# Patient Record
Sex: Male | Born: 1990 | Race: White | Hispanic: No | Marital: Married | Smoking: Never smoker
Health system: Southern US, Community
[De-identification: ages and names within clinical notes are randomized; demographics above are authoritative.]

## PROBLEM LIST (undated history)

## (undated) HISTORY — PX: KNEE SURGERY: SHX244

---

## 2021-12-10 ENCOUNTER — Encounter (HOSPITAL_BASED_OUTPATIENT_CLINIC_OR_DEPARTMENT_OTHER): Payer: Self-pay

## 2021-12-10 ENCOUNTER — Emergency Department (HOSPITAL_BASED_OUTPATIENT_CLINIC_OR_DEPARTMENT_OTHER): Payer: Worker's Compensation

## 2021-12-10 ENCOUNTER — Emergency Department (HOSPITAL_BASED_OUTPATIENT_CLINIC_OR_DEPARTMENT_OTHER)
Admission: EM | Admit: 2021-12-10 | Discharge: 2021-12-10 | Disposition: A | Discharge status: Home / Self Care | Payer: Worker's Compensation | Attending: Emergency Medicine | Admitting: Emergency Medicine

## 2021-12-10 DIAGNOSIS — M25561 Pain in right knee: Secondary | ICD-10-CM | POA: Diagnosis present

## 2021-12-10 DIAGNOSIS — W228XXA Striking against or struck by other objects, initial encounter: Secondary | ICD-10-CM | POA: Diagnosis not present

## 2021-12-10 DIAGNOSIS — Y990 Civilian activity done for income or pay: Secondary | ICD-10-CM | POA: Insufficient documentation

## 2021-12-10 MED ORDER — OXYCODONE-ACETAMINOPHEN 5-325 MG PO TABS: 1.0000 | ORAL_TABLET | Freq: Four times a day (QID) | ORAL | 0 refills | Status: AC | PRN

## 2021-12-10 MED ORDER — KETOROLAC TROMETHAMINE 15 MG/ML IJ SOLN
15.0000 mg | Freq: Once | INTRAMUSCULAR | Status: AC
  Administered 2021-12-10: 15 mg via INTRAMUSCULAR
  Filled 2021-12-10: qty 1

## 2021-12-10 NOTE — Discharge Instructions (Signed)
Please use brace for comfort.  You may use crutches if that would also be easier.  I will write you a work note for Wednesday.  Please call the orthopedist and schedule a follow-up appointment.  As we discussed, please take 800 mg ibuprofen every 8 hours.  You can also take Percocet for breakthrough pain.  Return to the emergency department for any worsening symptoms. ?

## 2021-12-10 NOTE — ED Triage Notes (Signed)
States was hit in back of right knee at work and knee dislocated. Able to reduce on its own. States hx of same. Was informed he would need a knee replacement to fix issue. ?

## 2021-12-10 NOTE — ED Provider Notes (Signed)
?[facility] EMERGENCY DEPARTMENT ?Provider Note ? ? ?CSN: [id number] ?Arrival date & time: 12/10/21  1404 ? ?  ? ?History ?Chief Complaint  ?Patient presents with  ? Knee Pain  ? ? ?Justin Bush is a 31 y.o. male with history of right knee MPFL reconstruction with lateral femoral condyle bone marrow aspirate with intraosseous microfracture to the lateral femoral condyle, loose body removal, and injection of platelet rich plasma performed in 09/2019 who presents to the emergency department with right knee pain started just prior to arrival.  Patient was at work when a empty large water barrel bumped him in the back leg and he felt his patella go out of place and came back into place.  He states that he gets this clicking sensation sometimes before he had the surgery.  Since then he has been having pain which she rates a 7/10 in severity. ? ? ?Knee Pain ? ?  ? ?Home Medications ?Prior to Admission medications   ?Medication Sig Start Date End Date Taking? Authorizing Provider  ?oxyCODONE-acetaminophen (PERCOCET/ROXICET) 5-325 MG tablet Take 1-2 tablets by mouth every 6 (six) hours as needed for severe pain. 12/10/21  Yes Teressa Lower, PA-C  ?   ? ?Allergies    ?Patient has no known allergies.   ? ?Review of Systems   ?Review of Systems  ?All other systems reviewed and are negative. ? ?Physical Exam ?Updated Vital Signs ?BP 127/77   Pulse 72   Temp 98.3 ?F (36.8 ?C) (Oral)   Resp 16   Ht 5\' 6"  (1.676 m)   Wt 104.3 kg   SpO2 100%   BMI 37.12 kg/m?  ?Physical Exam ?Vitals and nursing note reviewed.  ?Constitutional:   ?   Appearance: Normal appearance.  ?HENT:  ?   Head: Normocephalic and atraumatic.  ?Eyes:  ?   General:     ?   Right eye: No discharge.     ?   Left eye: No discharge.  ?   Conjunctiva/sclera: Conjunctivae normal.  ?Pulmonary:  ?   Effort: Pulmonary effort is normal.  ?Musculoskeletal:  ?   Comments: Well-healed surgical incision over the medial aspect of the right anterior knee.   Patella seems to be in good position without any obvious deformity.  ?Skin: ?   General: Skin is warm and dry.  ?   Findings: No rash.  ?Neurological:  ?   General: No focal deficit present.  ?   Mental Status: He is alert.  ?Psychiatric:     ?   Mood and Affect: Mood normal.     ?   Behavior: Behavior normal.  ? ? ?ED Results / Procedures / Treatments   ?Labs ?(all labs ordered are listed, but only abnormal results are displayed) ?Labs Reviewed - No data to display ? ?EKG ?None ? ?Radiology ?DG Knee Complete 4 Views Right ? ?Result Date: 12/10/2021 ?CLINICAL DATA:  Knee pain.  History of patellar dislocation. EXAM: RIGHT KNEE - COMPLETE 4+ VIEW COMPARISON:  None. FINDINGS: No fracture or dislocation. Several tiny ossicles are noted within the superior aspect of the suprapatellar knee joint space without associated knee joint effusion. 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 Suspected postoperative change of the patella with anchor device about the lateral distal femoral metaphysis. Regional soft tissues appear normal. No radiopaque foreign body. IMPRESSION: 1. No acute findings. 2. 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 3. Postoperative change of the patella including anchoring device within the lateral distal femoral metaphysis. Electronically Signed   By: Simonne Come M.D.   On: 12/10/2021 14:58   ? ?Procedures ?Procedures  ? ? ?Medications Ordered in ED ?Medications  ?ketorolac (TORADOL) 15 MG/ML injection 15 mg (15 mg Intramuscular Given 12/10/21 1437)  ? ? ?ED Course/ Medical Decision Making/ A&P ?  ?                        ?Medical Decision Making ?Amount and/or Complexity of Data Reviewed ?Radiology: ordered. ? ?Risk ?Prescription drug management. ? ? ?This patient presents to the ED for concern of right knee pain, this involves an  extensive number of treatment options, and is a complaint that carries with it a high risk of complications and morbidity.  The differential diagnosis includes ligamentous injury versus soft tissue injury.  I have a low suspicion for patellar dislocation as it does appear in good alignment.  I doubt full knee dislocation. ? ? ?Co morbidities that complicate the patient evaluation ? ?None ? ? ?Additional history obtained: ? ?Additional history obtained from nursing note ? ? ?Lab Tests: ? ?I Ordered, and personally interpreted labs.  The pertinent results include: None ? ? ?Imaging Studies ordered: ? ?I ordered imaging studies including right knee xray  ?I independently visualized and interpreted imaging which showed  ?I agree with the radiologist interpretation ? ? ?Cardiac Monitoring: ? ?The patient was maintained on a cardiac monitor.  I personally viewed and interpreted the cardiac monitored which showed an underlying rhythm of: Normal sinus rhythm ? ? ?Medicines ordered and prescription drug management: ? ?I ordered medication including Toradol for pain ?Reevaluation of the patient after these medicines showed that the patient improved ?I have reviewed the patients home medicines and have made adjustments as needed ? ? ?Problem List / ED Course: ? ?Right-sided knee pain with history of MPFL reconstruction.  Bones appear to be in normal alignment.  There is mild swelling to the medial aspect of the right knee.  There is a possibility that he reinjured his patella.  This would likely need orthopedic and MRI follow-up.  Low suspicion for compartment syndrome today.  Knee appears to be stable.  Pain was controlled.  Knee sleeve for comfort.  He is safe for discharge.  Strict return precautions were discussed. ? ? ?Reevaluation: ? ?After the interventions noted above, I reevaluated the patient and found that they have :improved ? ? ?Social Determinants of Health: ? ?None ? ? ?Dispostion: ? ?After consideration of the  diagnostic results and the patients response to treatment, I feel that the patent would benefit from outpatient follow-up with orthopedics. ? ?Final Clinical Impression(s) / ED Diagnoses ?Final diagnoses:  ?Acute pain of right knee  ? ? ?Rx / DC Orders ?ED Discharge Orders   ? ?      Ordered  ?  oxyCODONE-acetaminophen (PERCOCET/ROXICET) 5-325 MG tablet  Every 6 hours PRN       ? 12/10/21 1519  ? ?  ?  ? ?  ? ? ?  ?Honor Loh [place], [place] ?12/11/21 4854 ? ?  ?Maia Plan, MD ?12/17/21 0005 ? ?

## 2023-04-08 IMAGING — DX DG KNEE COMPLETE 4+V*R*
4 series · 4 of 4 positions shown · non-contrast
Comparison: None.

CLINICAL DATA: Knee pain.  History of patellar dislocation.

EXAM:
RIGHT KNEE - COMPLETE 4+ VIEW

[knee ap]
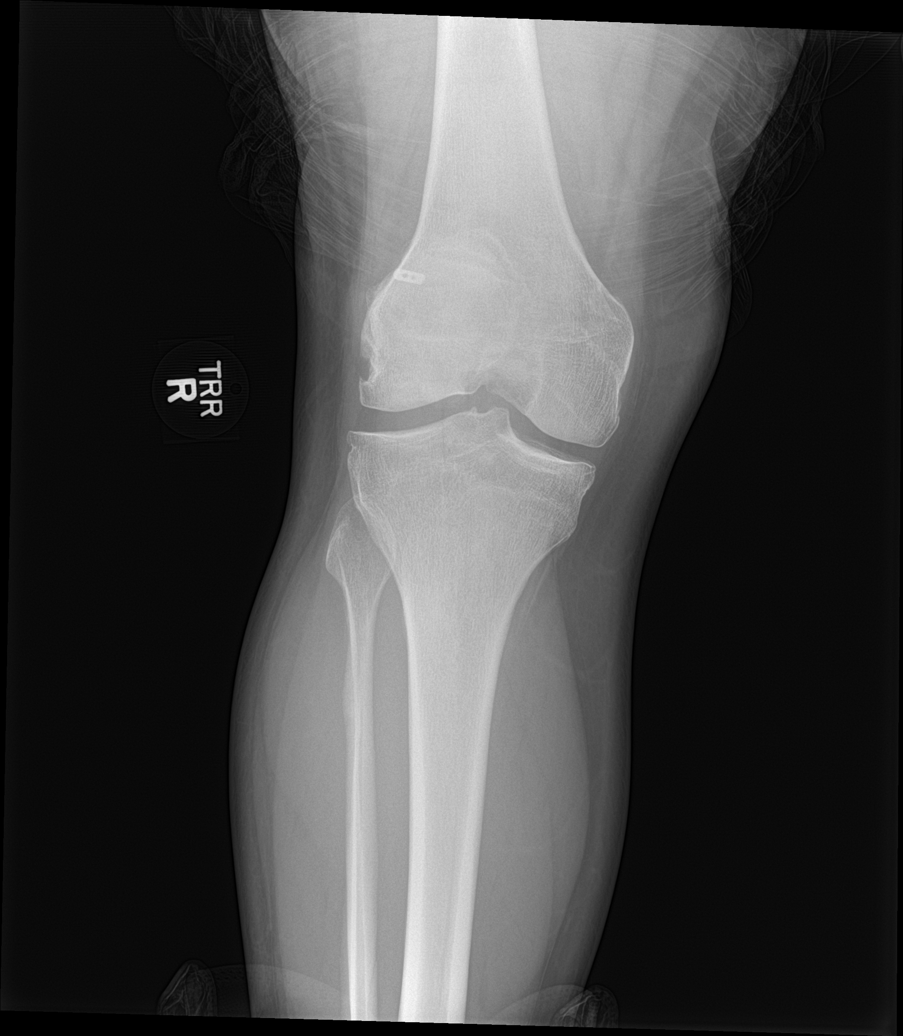

[knee obl (1 of 2)]
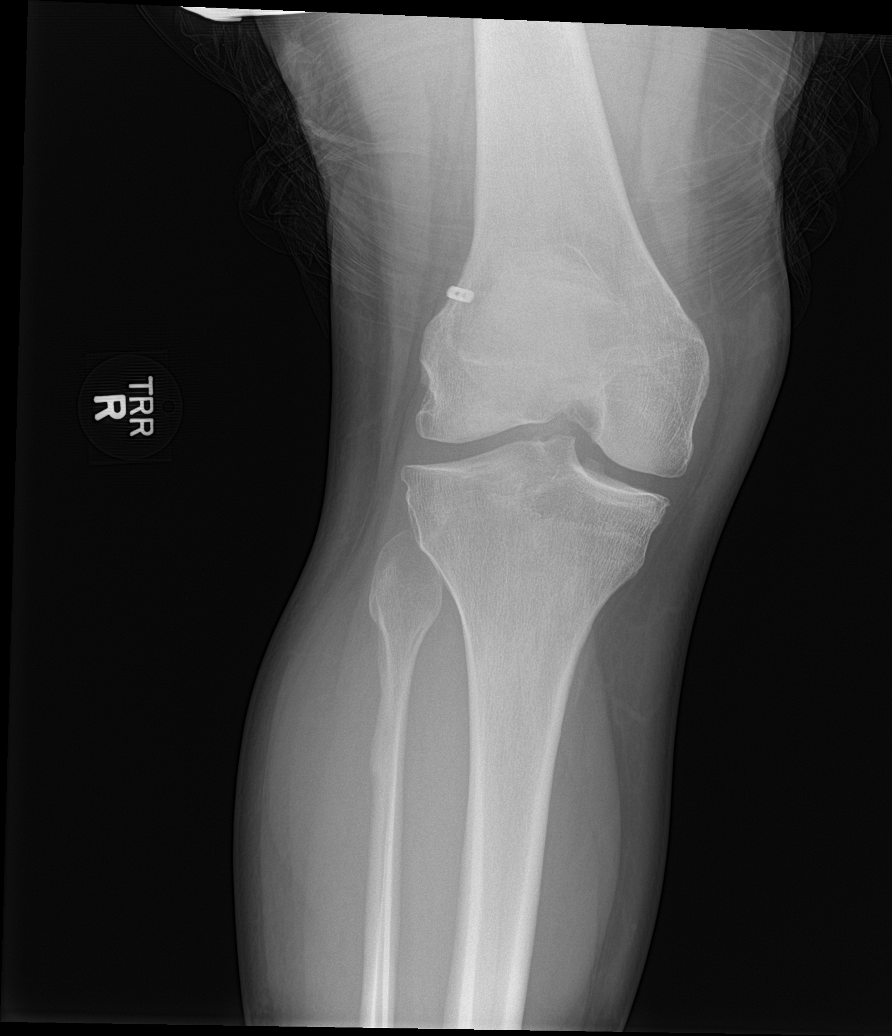

[knee obl (2 of 2)]
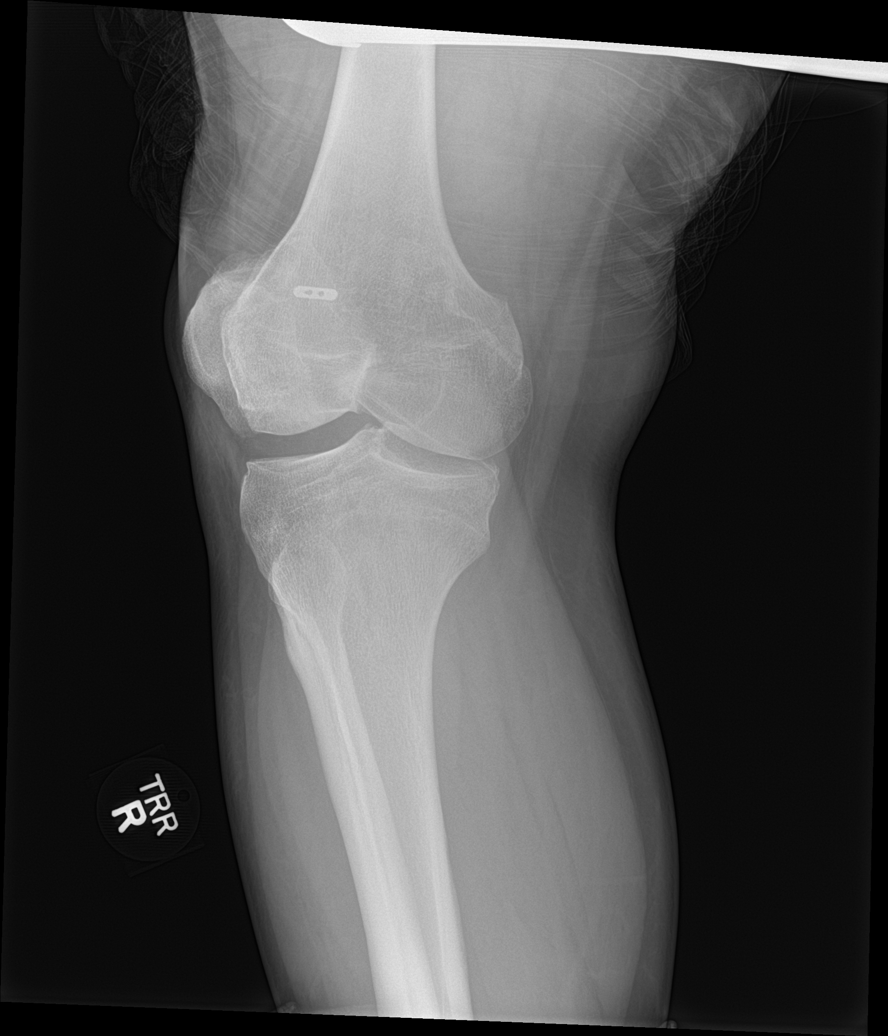

[knee lat]
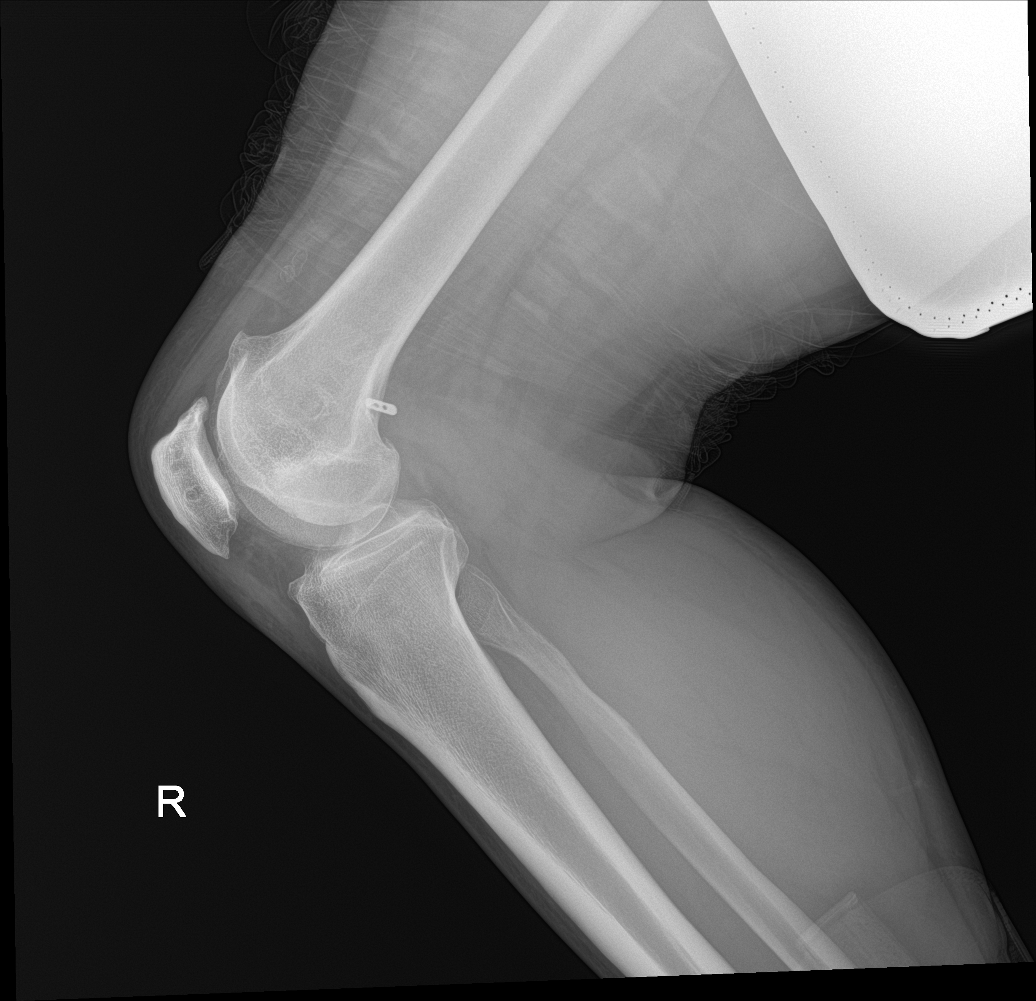

[4 of 4 positions shown; findings below may reference images not displayed]

FINDINGS: No fracture or dislocation. Several tiny ossicles are noted within
the superior aspect of the suprapatellar knee joint space without
associated knee joint effusion. Moderate tricompartmental
degenerative change of the knee, worse within the patellofemoral
joint with joint space loss, subchondral sclerosis and
osteophytosis. Suspected postoperative change of the patella with
anchor device about the lateral distal femoral metaphysis. Regional
soft tissues appear normal. No radiopaque foreign body.
IMPRESSION: 1. No acute findings.
2. Moderate tricompartmental degenerative change of the knee, worse
within the patellofemoral joint.
3. Postoperative change of the patella including anchoring device
within the lateral distal femoral metaphysis.
